# Patient Record
Sex: Female | Born: 1975 | Race: White | Hispanic: No | Marital: Married
Health system: Southern US, Community
[De-identification: ages and names within clinical notes are randomized; demographics above are authoritative.]

---

## 2017-12-31 ENCOUNTER — Other Ambulatory Visit: Payer: Self-pay | Admitting: Physician Assistant

## 2017-12-31 DIAGNOSIS — Z1231 Encounter for screening mammogram for malignant neoplasm of breast: Secondary | ICD-10-CM

## 2018-02-01 ENCOUNTER — Ambulatory Visit
Admission: RE | Admit: 2018-02-01 | Discharge: 2018-02-01 | Disposition: A | Discharge status: Home / Self Care | Payer: BLUE CROSS/BLUE SHIELD | Source: Ambulatory Visit | Attending: Physician Assistant | Admitting: Physician Assistant

## 2018-02-01 DIAGNOSIS — Z1231 Encounter for screening mammogram for malignant neoplasm of breast: Secondary | ICD-10-CM

## 2019-03-20 ENCOUNTER — Other Ambulatory Visit: Payer: Self-pay | Admitting: Physician Assistant

## 2019-03-20 DIAGNOSIS — Z1231 Encounter for screening mammogram for malignant neoplasm of breast: Secondary | ICD-10-CM

## 2019-04-28 ENCOUNTER — Other Ambulatory Visit: Payer: Self-pay

## 2019-04-28 ENCOUNTER — Ambulatory Visit
Admission: RE | Admit: 2019-04-28 | Discharge: 2019-04-28 | Disposition: A | Discharge status: Home / Self Care | Payer: BC Managed Care – PPO | Source: Ambulatory Visit | Attending: Physician Assistant | Admitting: Physician Assistant

## 2019-04-28 DIAGNOSIS — Z1231 Encounter for screening mammogram for malignant neoplasm of breast: Secondary | ICD-10-CM

## 2020-05-12 ENCOUNTER — Other Ambulatory Visit: Payer: Self-pay | Admitting: Physician Assistant

## 2020-05-12 DIAGNOSIS — Z1231 Encounter for screening mammogram for malignant neoplasm of breast: Secondary | ICD-10-CM

## 2020-07-06 ENCOUNTER — Ambulatory Visit
Admission: RE | Admit: 2020-07-06 | Discharge: 2020-07-06 | Disposition: A | Discharge status: Home / Self Care | Payer: BC Managed Care – PPO | Source: Ambulatory Visit | Attending: Physician Assistant | Admitting: Physician Assistant

## 2020-07-06 ENCOUNTER — Other Ambulatory Visit: Payer: Self-pay

## 2020-07-06 DIAGNOSIS — Z1231 Encounter for screening mammogram for malignant neoplasm of breast: Secondary | ICD-10-CM

## 2020-09-25 ENCOUNTER — Ambulatory Visit (HOSPITAL_COMMUNITY)
Admission: EM | Admit: 2020-09-25 | Discharge: 2020-09-25 | Disposition: A | Discharge status: Home / Self Care | Payer: BC Managed Care – PPO

## 2020-09-25 ENCOUNTER — Other Ambulatory Visit: Payer: Self-pay

## 2020-09-25 DIAGNOSIS — F331 Major depressive disorder, recurrent, moderate: Secondary | ICD-10-CM | POA: Diagnosis not present

## 2020-09-25 NOTE — Progress Notes (Signed)
Patient presents at the [facility] as a walk-in.  Patient states that she is going though a tough time.  She feels like her life is spinning out of control and she feels like she is unable to anything about it. Patient states that she wants to walk away from her situation, but she is unable to walk away from it.  Patient states that she has been married for 25 years and her husband moved to [place] to take care of his mother and while there, he had two affairs. She states that her husband wants a divorce.  Patient states that her husband told her that he has no intimate feelings toward her and she states that they went through marriage counseling and it did not help. Patient states that because of all this, she has not been able to sleep, she has been depressed and very anxious and states that she is crying all the time.  Patient states that she has thoughts of wishing she was not here and not have to go through all this, but she states that she is not suicidal and states that she has no plan to harm herself and states that she has never tried to harm herself in the past. Patient states that her husband told her that he has no intimate feelings toward her and she states that they went through marriage counseling and it did not help. Patient denies HI/Psychosis.  Patient states that she has not been sleeping or eating due to her anxiety and emotional distress.  Patient states that she normally does not drink, but she did last night trying to get some sleep.  Patient states that her PCP has her on Celexa and Lorazepam for her depression and anxiety.  Patient is routine.

## 2020-09-25 NOTE — Discharge Summary (Signed)
Lynetta Mare to be D/C'd Home per NP order. Discussed with the patient and all questions fully answered. An After Visit Summary was printed and given to the patient. Patient escorted out, and D/C home via private auto.  Dickie La  09/25/2020 12:08 PM

## 2020-09-25 NOTE — Discharge Instructions (Signed)
Take all medications as prescribed. Keep all follow-up appointments as scheduled.  Do not consume alcohol or use illegal drugs while on prescription medications. Report any adverse effects from your medications to your primary care provider promptly.  In the event of recurrent symptoms or worsening symptoms, call 911, a crisis hotline, or go to the nearest emergency department for evaluation.   

## 2020-09-25 NOTE — ED Provider Notes (Addendum)
Behavioral [facility] Urgent Care Medical Screening Exam  Patient Name: Erin Castaneda MRN: [id number] Date of Evaluation: 09/25/20 Chief Complaint:   Diagnosis:  Final diagnoses:  MDD (major depressive disorder), recurrent episode, moderate (HCC)    History of Present illness: Erin Castaneda is a 45 y.o. female.  Presents presents to [facility] urgent care due to worsening depression and anxiety.  She reports strained marriage.  States she has been married for the past 25 years and has recently experiencing infidelity issues. Stated that her husband as cheated on her twice that she is aware of.    She reports " deep depression like a mental dark whole" denied suicidal or homicidal ideations.  Denies auditory or visual hallucinations.  Reports her primary care provider is currently prescribing Celexa which she reports taking and tolerating well.  States she has a prescription for Ativan however does not use medications regularly.     Reported drinking a glass of wine to help get some sleep,but stated on she has going ruminations feels of worthlessness and hopeless.  Patient reported she is currently out of work due to her depression. Discussed following up with therapy.  Patient was receptive to plan.  Patient to be screened and evaluated by partial hospitalization programming.  Will consider medication adjustments at that time. Denied Erin illicit drug use or substance abuse history. Support, encouragement and reassurance was provided.   Psychiatric Specialty Exam  Presentation  General Appearance:Appropriate for Environment  Eye Contact:Good  Speech:Clear and Coherent  Speech Volume:Normal  Handedness:Right   Mood and Affect  Mood:Anxious  Affect: No data recorded  Thought Process  Thought Processes:Goal Directed  Descriptions of Associations:No data recorded Orientation:No data recorded Thought Content:Logical    Hallucinations:None  Ideas of Reference:None  Suicidal  Thoughts:No data recorded Homicidal Thoughts:No data recorded  Sensorium  Memory:Remote Good; Immediate Good  Judgment: No data recorded Insight: No data recorded  Executive Functions  Concentration: No data recorded Attention Span:Good  Recall:Good  Fund of Knowledge:Good  Language:Good   Psychomotor Activity  Psychomotor Activity:Normal   Assets  Assets:Intimacy   Sleep  Sleep:Fair  Number of hours:  No data recorded  Nutritional Assessment (For [facility] and [facility] admissions only) Has the patient had a weight loss or gain of 10 pounds or more in the last 3 months?: No Has the patient had a decrease in food intake/or appetite?: No Does the patient have dental problems?: No Does the patient have eating habits or behaviors that may be indicators of an eating disorder including binging or inducing vomiting?: No Has the patient recently lost weight without trying?: No Has the patient been eating poorly because of a decreased appetite?: No Malnutrition Screening Tool Score: 0   Physical Exam: Physical Exam Vitals reviewed.  Cardiovascular:     Rate and Rhythm: Normal rate.  Pulmonary:     Effort: Pulmonary effort is normal.  Skin:    General: Skin is warm and dry.  Neurological:     Mental Status: She is alert.  Psychiatric:        Attention and Perception: Attention normal.        Mood and Affect: Mood normal.        Speech: Speech normal.        Behavior: Behavior normal.        Thought Content: Thought content normal.        Cognition and Memory: Cognition normal.        Judgment: Judgment normal.   Review of  Systems  HENT: Negative.    Eyes: Negative.   Respiratory: Negative.    Cardiovascular: Negative.   Gastrointestinal: Negative.   Skin: Negative.   Neurological: Negative.   Endo/Heme/Allergies: Negative.   Psychiatric/Behavioral:  Positive for depression. Negative for suicidal ideas (passive ideaitons). The patient has insomnia. The patient is  not nervous/anxious.   All other systems reviewed and are negative. Blood pressure (!) 135/99, pulse 83, temperature 98.3 F (36.8 C), temperature source Oral, resp. rate 18, SpO2 100 %. There is no height or weight on file to calculate BMI.  Musculoskeletal: Strength & Muscle Tone: within normal limits Gait & Station: normal Patient leans: N/A   [facility] MSE Discharge Disposition for Follow up and Recommendations: Based on my evaluation the patient does not appear to have an emergency medical condition and can be discharged with resources and follow up care in outpatient services for Medication Management, Partial Hospitalization Program, Individual Therapy, and Group Therapy   Oneta Rack, NP 09/25/2020, 12:13 PM

## 2020-09-27 ENCOUNTER — Telehealth (HOSPITAL_COMMUNITY): Payer: Self-pay | Admitting: Psychiatry

## 2020-09-27 NOTE — Telephone Encounter (Signed)
D:  Erin Castaneda (TTS) referred pt to [facility].  A:  Oriented pt.  Encouraged pt to verify her insurance benefits.  Pt states she will call case manager back after she checks on her insurance.

## 2022-02-09 IMAGING — MG MM DIGITAL SCREENING BILAT W/ TOMO AND CAD
8 series · 8 of 24 positions shown · non-contrast
Comparison: Previous exam(s).

CLINICAL DATA: Screening.

EXAM:
DIGITAL SCREENING BILATERAL MAMMOGRAM WITH TOMOSYNTHESIS AND CAD
TECHNIQUE: Bilateral screening digital craniocaudal and mediolateral oblique
mammograms were obtained. Bilateral screening digital breast
tomosynthesis was performed. The images were evaluated with
computer-aided detection.

[R MLO synth-2D]
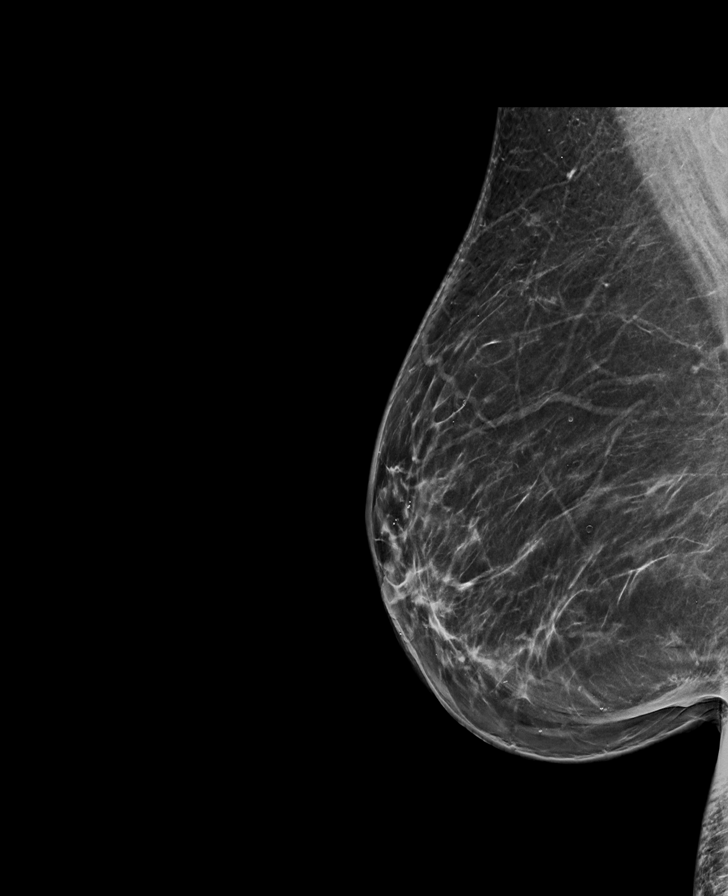

[R CC synth-2D]
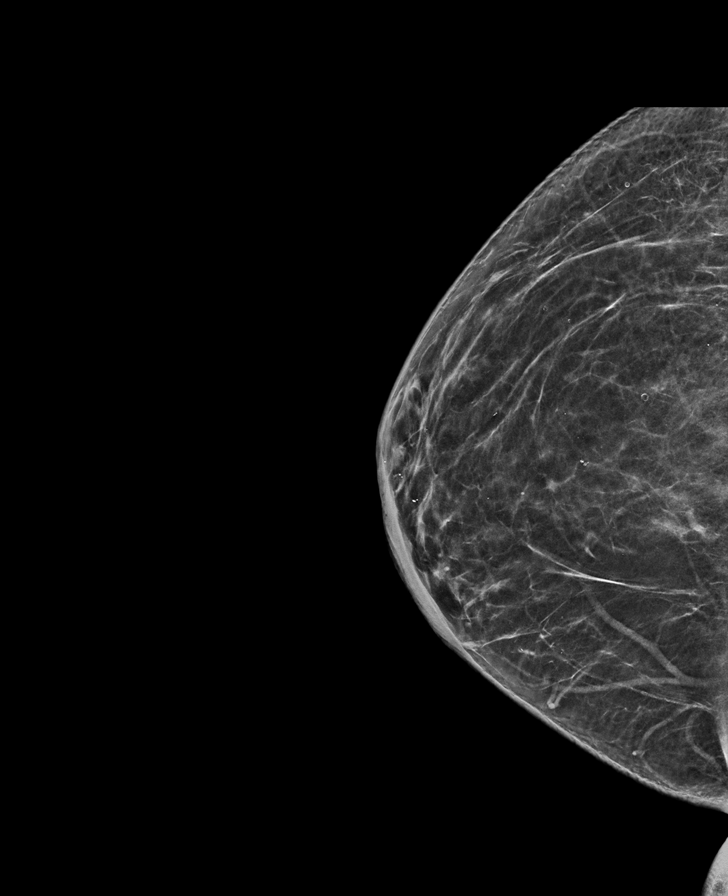

[L CC synth-2D]
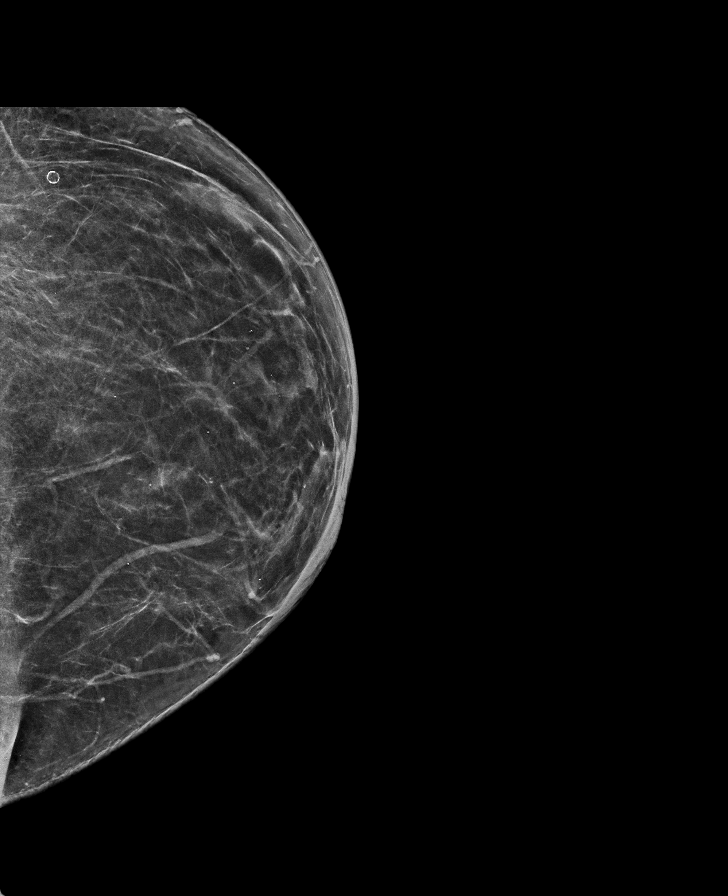

[L MLO synth-2D]
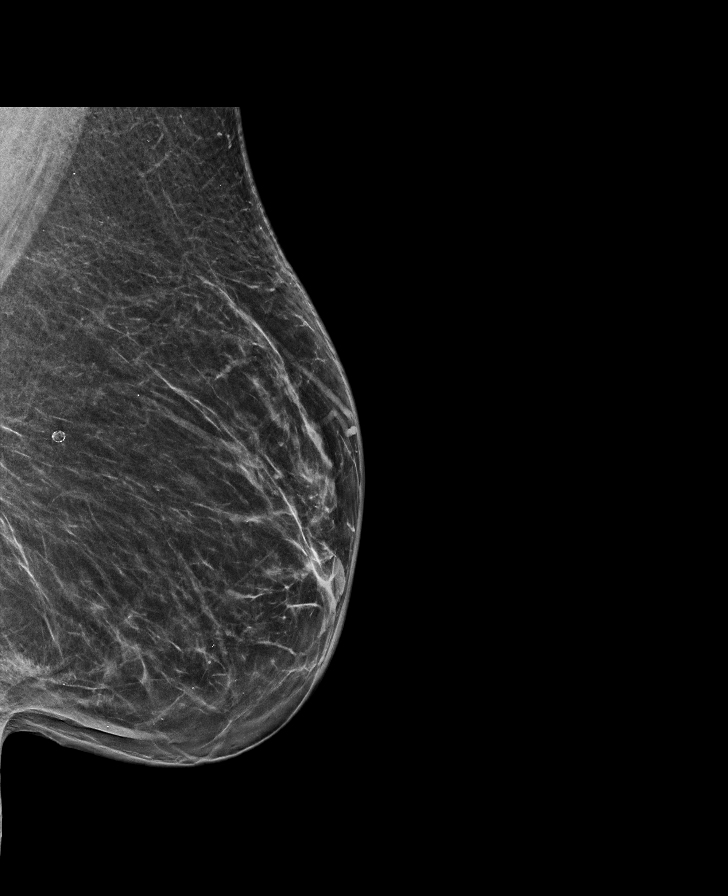

[L MLO tomo · tomo slice 37/72.0]
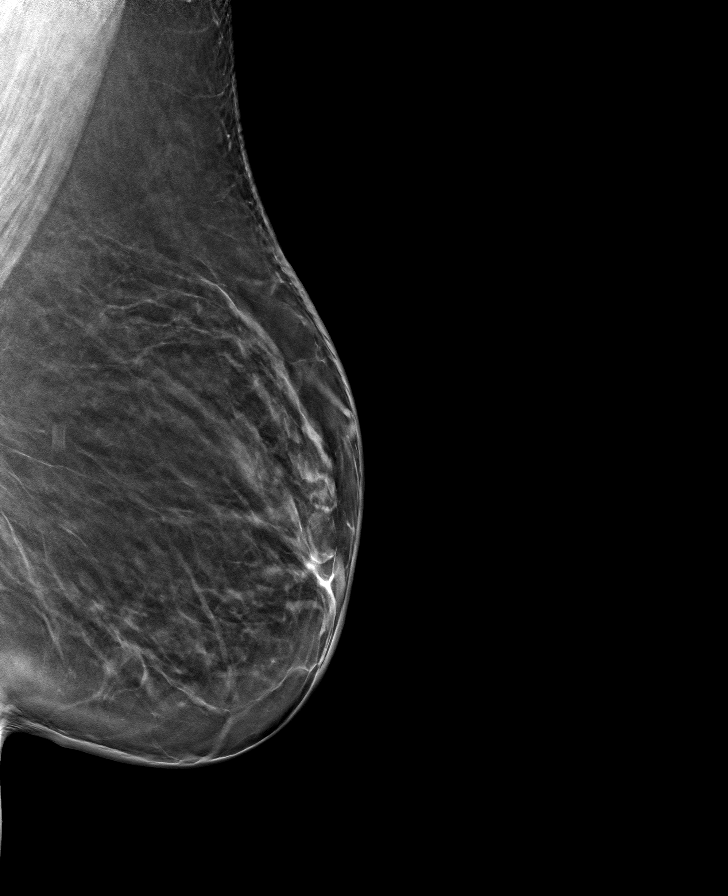

[R CC tomo · tomo slice 33/65.0]
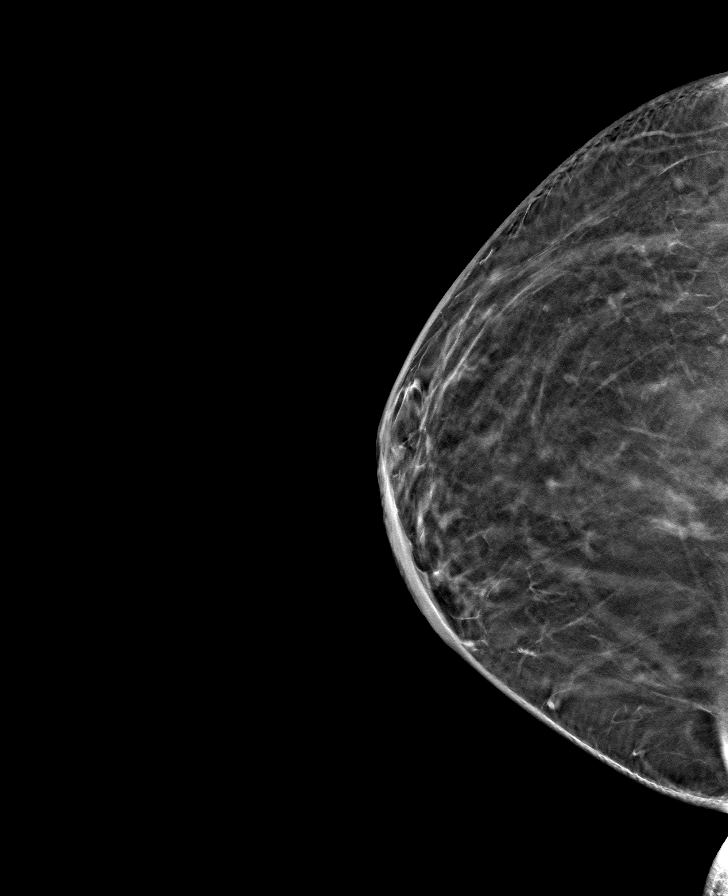

[L CC tomo · tomo slice 34/67.0]
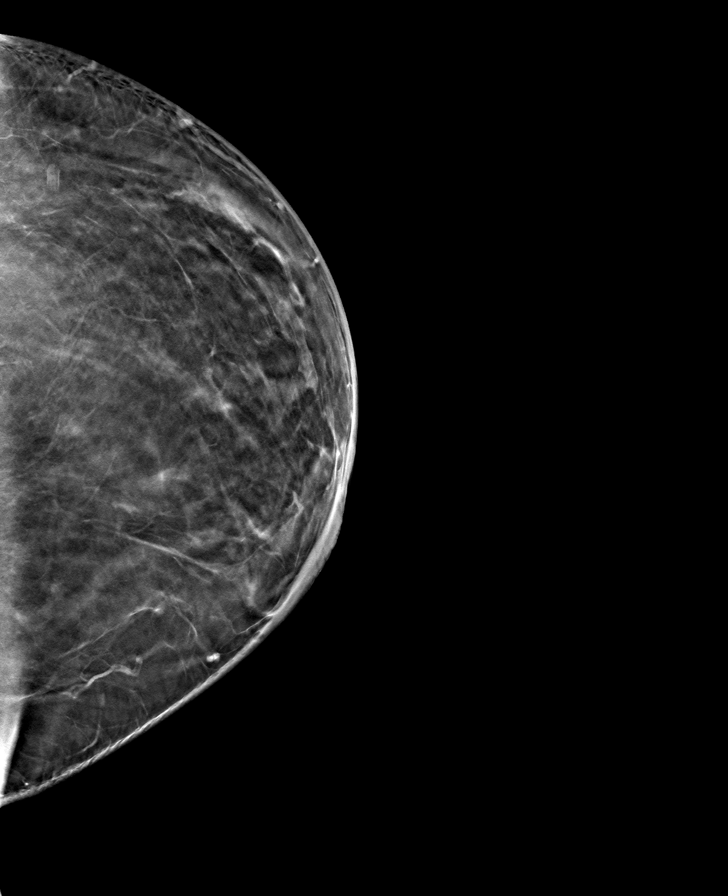

[R MLO tomo · tomo slice 39/78.0]
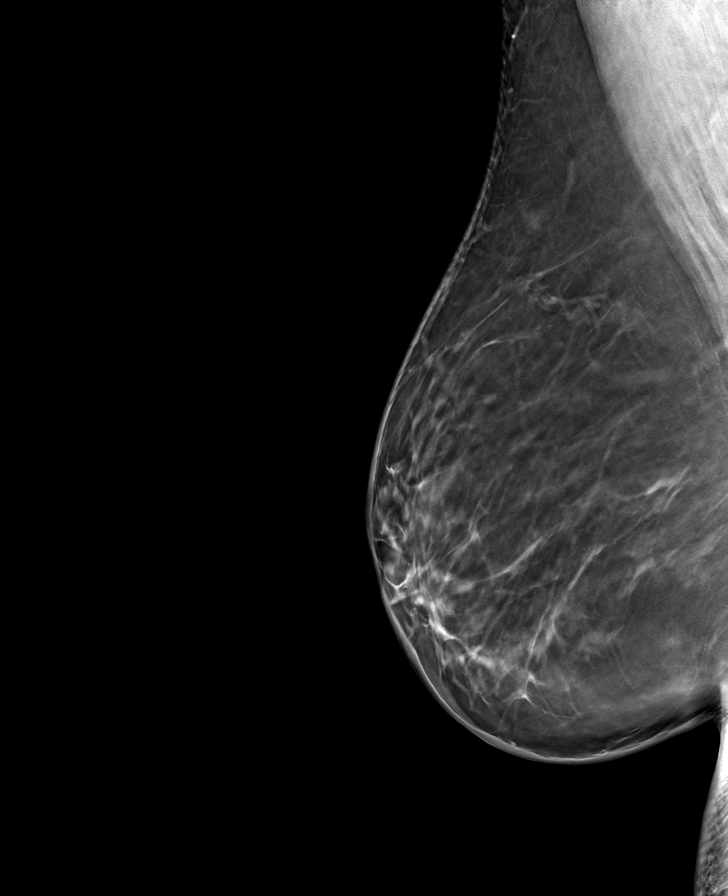

[8 of 24 positions shown; findings below may reference images not displayed]

ACR Breast Density Category b: There are scattered areas of
fibroglandular density.
FINDINGS: There are no findings suspicious for malignancy. The images were
evaluated with computer-aided detection.
IMPRESSION: No mammographic evidence of malignancy. A result letter of this
screening mammogram will be mailed directly to the patient.

RECOMMENDATION:
Screening mammogram in one year. (Code:WJ-[id number])

BI-RADS CATEGORY  1: Negative.
